# Patient Record
Sex: Female | Born: 1985 | Hispanic: Yes | Marital: Single | Smoking: Never smoker
Health system: Southern US, Community
[De-identification: ages and names within clinical notes are randomized; demographics above are authoritative.]

---

## 2009-04-08 ENCOUNTER — Observation Stay: Payer: Self-pay

## 2009-04-09 ENCOUNTER — Inpatient Hospital Stay: Payer: Self-pay | Admitting: Unknown Physician Specialty

## 2015-02-06 ENCOUNTER — Emergency Department: Payer: Medicaid Other

## 2015-02-06 ENCOUNTER — Encounter: Payer: Self-pay | Admitting: Medical Oncology

## 2015-02-06 ENCOUNTER — Observation Stay
Admission: EM | Admit: 2015-02-06 | Discharge: 2015-02-09 | Disposition: A | Discharge status: Home / Self Care | Payer: Medicaid Other | Attending: Internal Medicine | Admitting: Internal Medicine

## 2015-02-06 DIAGNOSIS — R748 Abnormal levels of other serum enzymes: Secondary | ICD-10-CM | POA: Diagnosis present

## 2015-02-06 DIAGNOSIS — Z23 Encounter for immunization: Secondary | ICD-10-CM | POA: Diagnosis not present

## 2015-02-06 DIAGNOSIS — K838 Other specified diseases of biliary tract: Secondary | ICD-10-CM

## 2015-02-06 DIAGNOSIS — R1013 Epigastric pain: Secondary | ICD-10-CM | POA: Insufficient documentation

## 2015-02-06 DIAGNOSIS — R7989 Other specified abnormal findings of blood chemistry: Secondary | ICD-10-CM | POA: Diagnosis present

## 2015-02-06 DIAGNOSIS — K8020 Calculus of gallbladder without cholecystitis without obstruction: Secondary | ICD-10-CM | POA: Diagnosis not present

## 2015-02-06 DIAGNOSIS — R1010 Upper abdominal pain, unspecified: Secondary | ICD-10-CM

## 2015-02-06 DIAGNOSIS — R945 Abnormal results of liver function studies: Secondary | ICD-10-CM

## 2015-02-06 DIAGNOSIS — R109 Unspecified abdominal pain: Secondary | ICD-10-CM

## 2015-02-06 LAB — POCT PREGNANCY, URINE: PREG TEST UR: NEGATIVE

## 2015-02-06 LAB — URINALYSIS COMPLETE WITH MICROSCOPIC (ARMC ONLY)
BACTERIA UA: NONE SEEN
Bilirubin Urine: NEGATIVE
GLUCOSE, UA: NEGATIVE mg/dL
HGB URINE DIPSTICK: NEGATIVE
Ketones, ur: NEGATIVE mg/dL
LEUKOCYTES UA: NEGATIVE
Nitrite: NEGATIVE
PH: 8 (ref 5.0–8.0)
PROTEIN: NEGATIVE mg/dL
Specific Gravity, Urine: 1.01 (ref 1.005–1.030)

## 2015-02-06 LAB — COMPREHENSIVE METABOLIC PANEL
ALBUMIN: 4.2 g/dL (ref 3.5–5.0)
ALT: 597 U/L — ABNORMAL HIGH (ref 14–54)
AST: 1211 U/L — AB (ref 15–41)
Alkaline Phosphatase: 244 U/L — ABNORMAL HIGH (ref 38–126)
Anion gap: 5 (ref 5–15)
BUN: 11 mg/dL (ref 6–20)
CHLORIDE: 109 mmol/L (ref 101–111)
CO2: 22 mmol/L (ref 22–32)
Calcium: 9 mg/dL (ref 8.9–10.3)
Creatinine, Ser: 0.52 mg/dL (ref 0.44–1.00)
GFR calc Af Amer: >60 mL/min (ref >60)
GLUCOSE: 118 mg/dL — AB (ref 65–99)
POTASSIUM: 3.8 mmol/L (ref 3.5–5.1)
SODIUM: 136 mmol/L (ref 135–145)
Total Bilirubin: 1.6 mg/dL — ABNORMAL HIGH (ref 0.3–1.2)
Total Protein: 7.8 g/dL (ref 6.5–8.1)

## 2015-02-06 LAB — ACETAMINOPHEN LEVEL: Acetaminophen (Tylenol), Serum: 11 ug/mL (ref 10–30)

## 2015-02-06 LAB — CBC
HEMATOCRIT: 40.7 % (ref 35.0–47.0)
Hemoglobin: 13.6 g/dL (ref 12.0–16.0)
MCH: 29.9 pg (ref 26.0–34.0)
MCHC: 33.4 g/dL (ref 32.0–36.0)
MCV: 89.6 fL (ref 80.0–100.0)
Platelets: 196 10*3/uL (ref 150–440)
RBC: 4.54 MIL/uL (ref 3.80–5.20)
RDW: 12.7 % (ref 11.5–14.5)
WBC: 7.9 10*3/uL (ref 3.6–11.0)

## 2015-02-06 LAB — LIPASE, BLOOD
LIPASE: 25 U/L (ref 11–51)
LIPASE: 26 U/L (ref 11–51)

## 2015-02-06 LAB — PROTIME-INR
INR: 1.06
Prothrombin Time: 14 seconds (ref 11.4–15.0)

## 2015-02-06 LAB — TROPONIN I

## 2015-02-06 MED ORDER — IOHEXOL 240 MG/ML SOLN
25.0000 mL | Freq: Once | INTRAMUSCULAR | Status: AC | PRN
Start: 2015-02-06 — End: 2015-02-06
  Administered 2015-02-06: 25 mL via ORAL

## 2015-02-06 MED ORDER — MORPHINE SULFATE (PF) 4 MG/ML IV SOLN
4.0000 mg | Freq: Once | INTRAVENOUS | Status: AC
  Administered 2015-02-06: 4 mg via INTRAVENOUS
  Filled 2015-02-06: qty 1

## 2015-02-06 MED ORDER — SENNOSIDES-DOCUSATE SODIUM 8.6-50 MG PO TABS
1.0000 | ORAL_TABLET | Freq: Every evening | ORAL | Status: DC | PRN
Start: 2015-02-06 — End: 2015-02-09

## 2015-02-06 MED ORDER — ACETAMINOPHEN 325 MG PO TABS: 650.0000 mg | ORAL_TABLET | Freq: Four times a day (QID) | ORAL | Status: DC | PRN

## 2015-02-06 MED ORDER — HYDROCODONE-ACETAMINOPHEN 5-325 MG PO TABS
1.0000 | ORAL_TABLET | ORAL | Status: DC | PRN
  Administered 2015-02-06: 1 via ORAL
  Administered 2015-02-08: 2 via ORAL
  Filled 2015-02-06: qty 2
  Filled 2015-02-06: qty 1

## 2015-02-06 MED ORDER — ONDANSETRON HCL 4 MG/2ML IJ SOLN
4.0000 mg | Freq: Four times a day (QID) | INTRAMUSCULAR | Status: DC | PRN
Start: 2015-02-06 — End: 2015-02-09
  Administered 2015-02-08: 4 mg via INTRAVENOUS
  Filled 2015-02-06: qty 2

## 2015-02-06 MED ORDER — GI COCKTAIL ~~LOC~~
30.0000 mL | Freq: Once | ORAL | Status: AC
  Administered 2015-02-06: 30 mL via ORAL
  Filled 2015-02-06: qty 30

## 2015-02-06 MED ORDER — ACETAMINOPHEN 650 MG RE SUPP: 650.0000 mg | Freq: Four times a day (QID) | RECTAL | Status: DC | PRN

## 2015-02-06 MED ORDER — ONDANSETRON HCL 4 MG/2ML IJ SOLN
4.0000 mg | Freq: Once | INTRAMUSCULAR | Status: AC
  Administered 2015-02-06: 4 mg via INTRAVENOUS
  Filled 2015-02-06: qty 2

## 2015-02-06 MED ORDER — ENOXAPARIN SODIUM 40 MG/0.4ML ~~LOC~~ SOLN
40.0000 mg | SUBCUTANEOUS | Status: DC
  Administered 2015-02-06 – 2015-02-08 (×2): 40 mg via SUBCUTANEOUS
  Filled 2015-02-06 (×2): qty 0.4

## 2015-02-06 MED ORDER — ALUM & MAG HYDROXIDE-SIMETH 200-200-20 MG/5ML PO SUSP: 30.0000 mL | Freq: Four times a day (QID) | ORAL | Status: DC | PRN

## 2015-02-06 MED ORDER — IOHEXOL 300 MG/ML  SOLN
100.0000 mL | Freq: Once | INTRAMUSCULAR | Status: AC | PRN
  Administered 2015-02-06: 100 mL via INTRAVENOUS

## 2015-02-06 MED ORDER — SODIUM CHLORIDE 0.9 % IV SOLN
INTRAVENOUS | Status: DC
  Administered 2015-02-06 – 2015-02-09 (×7): via INTRAVENOUS

## 2015-02-06 MED ORDER — ONDANSETRON HCL 4 MG PO TABS: 4.0000 mg | ORAL_TABLET | Freq: Four times a day (QID) | ORAL | Status: DC | PRN

## 2015-02-06 NOTE — ED Notes (Signed)
Pt reports upper abd pain since yesterday morning with reports of nvd also.

## 2015-02-06 NOTE — ED Notes (Signed)
MD Lord at bedside. 

## 2015-02-06 NOTE — Consult Note (Signed)
GI Inpatient Consult Note Vickie Reese [name] MD  Reason for Consult: abnormal liver tests   Attending Requesting Consult: Dr. Juliene PinaMody  Outpatient Primary Physician: no primary care physician  History of Present Illness: Vickie Reese is a 29 y.o. femalepresenting with nausea vomiting and epigastric pain. Patient states she was in her usual state of health until yesterday morning. She began to develop some epigastric pain. This seemed to radiate both the left and right upper quadrants. He was nauseated and had vomiting. sHe had some diarrhea with this early but not since. Currently her symptoms have improved after hospitalization.  Patient states that she generally does not have any symptoms of nausea vomiting or abdominal pain, no heartburn or dysphagia. She has a bowel movement daily. sHe is seen no black stool blood in the stool or slimy stools. He has never had a endoscopy or colonoscopy. She will occasionally take an aspirin product perhaps once a week. She takes no ibuprofen.She has had no abdominal surgery.  Past Medical History:  History reviewed. No pertinent past medical history.  Problem List: Patient Active Problem List   Diagnosis Date Noted  . Elevated liver enzymes 02/06/2015    Past Surgical History: History reviewed. No pertinent past surgical history.  Allergies: No Known Allergies  Home Medications: No prescriptions prior to admission   Home medication reconciliation was completed with the patient.   Scheduled Inpatient Medications:   . enoxaparin (LOVENOX) injection  40 mg Subcutaneous Q24H    Continuous Inpatient Infusions:   . sodium chloride 125 mL/hr at 02/06/15 1636    PRN Inpatient Medications:  acetaminophen **OR** acetaminophen, alum & mag hydroxide-simeth, HYDROcodone-acetaminophen, ondansetron **OR** ondansetron (ZOFRAN) IV, senna-docusate  Family History: family history is not on file.   GI Family History: negative for colorectal cancer  or liver disease. She does have a brother who had to have his gallbladder removed.  Social History:   reports that she has never smoked. She does not have any smokeless tobacco history on file. The patient denies ETOH, tobacco, or drug use.   ROS  Review of Systems: 10 systems reviewed per admission history and physical agree with same.  Physical Examination: BP 100/71 mmHg  Pulse 59  Temp(Src) 98.1 F (36.7 C) (Oral)  Resp 16  Ht 5' (1.524 m)  Wt 64.411 kg (142 lb)  BMI 27.73 kg/m2  SpO2 100%  LMP 12/23/2014 (Exact Date) Gen: 29 year old female no current distress HEENT:normal cephalic atraumatic eyes are anicteric Neck: supple no JVD no lymphadenopathy no thyromegaly Chest: bilaterally clear to auscultation CV: regular rate and rhythm without rub murmur or gallop Abd: soft mild tenderness to palpation in the upper epigastric region. This seems to also be somewhat toward the right of midline. There is minimal tenderness currently. Is no rebound. Bowel sounds are positive Ext: no clubbing cyanosis or edema Skin:warm and dryOther:  Data: Lab Results  Component Value Date   WBC 7.9 02/06/2015   HGB 13.6 02/06/2015   HCT 40.7 02/06/2015   MCV 89.6 02/06/2015   PLT 196 02/06/2015    Recent Labs Lab 02/06/15 0747  HGB 13.6   Lab Results  Component Value Date   NA 136 02/06/2015   K 3.8 02/06/2015   CL 109 02/06/2015   CO2 22 02/06/2015   BUN 11 02/06/2015   CREATININE 0.52 02/06/2015   Lab Results  Component Value Date   ALT 597* 02/06/2015   AST 1211* 02/06/2015   ALKPHOS 244* 02/06/2015   BILITOT  1.6* 02/06/2015    Recent Labs Lab 02/06/15 0747  INR 1.06   CBC Latest Ref Rng 02/06/2015  WBC 3.6 - 11.0 K/uL 7.9  Hemoglobin 12.0 - 16.0 g/dL 16.1  Hematocrit 09.6 - 47.0 % 40.7  Platelets 150 - 440 K/uL 196    STUDIES: Ct Abdomen Pelvis W Contrast  02/06/2015  CLINICAL DATA:  Patient with upper abdominal pain and vomiting. EXAM: CT ABDOMEN AND  PELVIS WITH CONTRAST TECHNIQUE: Multidetector CT imaging of the abdomen and pelvis was performed using the standard protocol following bolus administration of intravenous contrast. CONTRAST:  100 cc Omnipaque 300 COMPARISON:  Right upper quadrant ultrasound earlier same day. FINDINGS: Lower chest: Heart is normal in size. No pericardial effusion. Minimal atelectasis right lower lobe. No consolidative pulmonary opacity. No pleural effusion. Hepatobiliary: Liver is normal in size and contour. No focal hepatic lesion is identified. Multiple calcified stones are demonstrated within the gallbladder lumen. No intrahepatic or extrahepatic biliary ductal dilatation. High density material is demonstrated throughout the cystic duct and common bile duct which is nondilated. Pancreas: Unremarkable Spleen: Unremarkable Adrenals/Urinary Tract: The adrenal glands are normal. Kidneys enhance symmetrically with contrast. No hydronephrosis. Urinary bladder is unremarkable. Stomach/Bowel: No abnormal bowel wall thickening or evidence for bowel obstruction. The appendix is normal. No free fluid or free intraperitoneal air. Normal morphology of the stomach. Vascular/Lymphatic: Normal caliber abdominal aorta. No retroperitoneal lymphadenopathy. Other: Uterus is unremarkable. Follicle within the left ovary. Normal appearing right ovary. Musculoskeletal: No aggressive or acute appearing osseous lesions. IMPRESSION: There is increased density throughout the cystic duct and proximal common bile duct compatible with choledocholithiasis. The common bile duct is nondilated. There are multiple additional stones and sludge within the gallbladder lumen. Electronically Signed   By: Annia Belt M.D.   On: 02/06/2015 13:31   US Abdomen Limited Ruq  02/06/2015  CLINICAL DATA:  Abnormal liver function studies, 24 hour history of right upper quadrant and generalized abdominal pain associated with nausea and vomiting EXAM: US ABDOMEN LIMITED - RIGHT  UPPER QUADRANT COMPARISON:  None in PACs FINDINGS: Gallbladder: The gallbladder is adequately distended. There are multiple echogenic shadowing stones. Some stones may be mobile. There is no gallbladder wall thickening, pericholecystic fluid, or positive sonographic Murphy's sign. Common bile duct: Diameter: 5.3 mm Liver: The liver exhibits normal echotexture with no focal mass or ductal dilation. IMPRESSION: 1. Multiple gallstones without sonographic evidence of acute cholecystitis. 2. Normal appearance of the liver and common bile duct. Electronically Signed   By: David  [place] M.D.   On: 02/06/2015 11:00   @  Assessment:1.epigastric pain nausea and vomiting with elevated liver enzymes.differential diagnosis to include biliary disorder such as cholelithiasis/acute cholecystitis. However the amount of elevation of the liver enzymes is slightly higher than would be expected for cholecystitis and bilirubin is only minimally elevated.it is of note that the ultrasound showed no evidence of ohms and in the common bile duct or the CT scan did show evidence of likely sludge in the common bile duct which would be consistent clinically with her having passed a stone or sludgy material. currently she is feeling better.of less consideration would be primary issues in the stomach such as gastritis or ulcer.  Recommendations:1. Agree with MRCP as you have scheduled. Would further consider hepatobiliary study to rule out cystic duct obstruction. Daily LFTs and CBC. 2. Follow with you  Thank you for the consult. Please call with questions or concerns.  Vickie Deem, MD  02/06/2015 5:30 PM

## 2015-02-06 NOTE — H&P (Signed)
[facility] - [place] at [facility]   PATIENT NAME: Vickie Reese    MR#:  [id number]  DATE OF BIRTH:  1986-01-03  DATE OF ADMISSION:  02/06/2015  PRIMARY CARE PHYSICIAN: No PCP Per Patient   REQUESTING/REFERRING PHYSICIAN: Dr Shaune Pollack  CHIEF COMPLAINT:  Mid epigastric pain  HISTORY OF PRESENT ILLNESS:  Vickie Reese  is a 29 y.o. female with no past medical history who presents with above complaint. Patient reports since yesterday she had midepigastric abdominal pain radiating to the left back. She also has had nausea and vomiting since moving. She denies fever. She took 2 Tylenol yesterday due to the pain. She denies melena or hematochezia. She denies hematemesis.  In the emergency room she was noted have elevated LFTs. CT scan is concerning for CBD stones. ER physician spoke with GI and surgery who do not believe her elevated LFTs are from gallstones.  PAST MEDICAL HISTORY:  None  PAST SURGICAL HISTORY:  None  SOCIAL HISTORY:   Social History  Substance Use Topics  . Smoking status: Never Smoker   . Smokeless tobacco: Not on file  . Alcohol Use: Not on file    FAMILY HISTORY:  No history of hypertension or CAD  DRUG ALLERGIES:  No Known Allergies   REVIEW OF SYSTEMS:  CONSTITUTIONAL: No fever, fatigue or weakness.  EYES: No blurred or double vision.  EARS, NOSE, AND THROAT: No tinnitus or ear pain.  RESPIRATORY: No cough, shortness of breath, wheezing or hemoptysis.  CARDIOVASCULAR: No chest pain, orthopnea, edema.  GASTROINTESTINAL: positive nausea, vomiting, loose stools and epigastric abdominal pain .  GENITOURINARY: No dysuria, hematuria.  ENDOCRINE: No polyuria, nocturia,  HEMATOLOGY: No anemia, easy bruising or bleeding SKIN: No rash or lesion. MUSCULOSKELETAL: No joint pain or arthritis.   NEUROLOGIC: No tingling, numbness, weakness.  PSYCHIATRY: No anxiety or depression.   MEDICATIONS AT HOME:   None          VITAL SIGNS:  Blood pressure 128/72, pulse 63, temperature 98.6 F (37 C), temperature source Oral, resp. rate 16, height 5' (1.524 m), weight 64.411 kg (142 lb), last menstrual period 12/23/2014, SpO2 100 %.  PHYSICAL EXAMINATION:  GENERAL:  29 y.o.-year-old patient lying in the bed with no acute distress.  EYES: Pupils equal, round, reactive to light and accommodation. No scleral icterus. Extraocular muscles intact.  HEENT: Head atraumatic, normocephalic. Oropharynx and nasopharynx clear.  NECK:  Supple, no jugular venous distention. No thyroid enlargement, no tenderness.  LUNGS: Normal breath sounds bilaterally, no wheezing, rales,rhonchi or crepitation. No use of accessory muscles of respiration.  CARDIOVASCULAR: S1, S2 normal. No murmurs, rubs, or gallops.  ABDOMEN: Midepigastric abdominal pain without rebound or guarding. Bowel sounds present. No organomegaly or mass.  EXTREMITIES: No pedal edema, cyanosis, or clubbing.  NEUROLOGIC: Cranial nerves II through XII are grossly intact. No focal deficits. PSYCHIATRIC: The patient is alert and oriented x 3.  SKIN: No obvious rash, lesion, or ulcer.   LABORATORY PANEL:   CBC  Recent Labs Lab 02/06/15 0747  WBC 7.9  HGB 13.6  HCT 40.7  PLT 196   ------------------------------------------------------------------------------------------------------------------  Chemistries   Recent Labs Lab 02/06/15 0747  NA 136  K 3.8  CL 109  CO2 22  GLUCOSE 118*  BUN 11  CREATININE 0.52  CALCIUM 9.0  AST 1211*  ALT 597*  ALKPHOS 244*  BILITOT 1.6*   ------------------------------------------------------------------------------------------------------------------  Cardiac Enzymes  Recent Labs Lab 02/06/15 0747  TROPONINI <0.03   ------------------------------------------------------------------------------------------------------------------  RADIOLOGY:  Ct Abdomen Pelvis W Contrast  02/06/2015  CLINICAL DATA:  Patient  with upper abdominal pain and vomiting. EXAM: CT ABDOMEN AND PELVIS WITH CONTRAST TECHNIQUE: Multidetector CT imaging of the abdomen and pelvis was performed using the standard protocol following bolus administration of intravenous contrast. CONTRAST:  100 cc Omnipaque 300 COMPARISON:  Right upper quadrant ultrasound earlier same day. FINDINGS: Lower chest: Heart is normal in size. No pericardial effusion. Minimal atelectasis right lower lobe. No consolidative pulmonary opacity. No pleural effusion. Hepatobiliary: Liver is normal in size and contour. No focal hepatic lesion is identified. Multiple calcified stones are demonstrated within the gallbladder lumen. No intrahepatic or extrahepatic biliary ductal dilatation. High density material is demonstrated throughout the cystic duct and common bile duct which is nondilated. Pancreas: Unremarkable Spleen: Unremarkable Adrenals/Urinary Tract: The adrenal glands are normal. Kidneys enhance symmetrically with contrast. No hydronephrosis. Urinary bladder is unremarkable. Stomach/Bowel: No abnormal bowel wall thickening or evidence for bowel obstruction. The appendix is normal. No free fluid or free intraperitoneal air. Normal morphology of the stomach. Vascular/Lymphatic: Normal caliber abdominal aorta. No retroperitoneal lymphadenopathy. Other: Uterus is unremarkable. Follicle within the left ovary. Normal appearing right ovary. Musculoskeletal: No aggressive or acute appearing osseous lesions. IMPRESSION: There is increased density throughout the cystic duct and proximal common bile duct compatible with choledocholithiasis. The common bile duct is nondilated. There are multiple additional stones and sludge within the gallbladder lumen. Electronically Signed   By: Annia Beltrew  Davis M.D.   On: 02/06/2015 13:31   [place] Abdomen Limited Ruq  02/06/2015  CLINICAL DATA:  Abnormal liver function studies, 24 hour history of right upper quadrant and generalized abdominal pain  associated with nausea and vomiting EXAM: US ABDOMEN LIMITED - RIGHT UPPER QUADRANT COMPARISON:  None in PACs FINDINGS: Gallbladder: The gallbladder is adequately distended. There are multiple echogenic shadowing stones. Some stones may be mobile. There is no gallbladder wall thickening, pericholecystic fluid, or positive sonographic Murphy's sign. Common bile duct: Diameter: 5.3 mm Liver: The liver exhibits normal echotexture with no focal mass or ductal dilation. IMPRESSION: 1. Multiple gallstones without sonographic evidence of acute cholecystitis. 2. Normal appearance of the liver and common bile duct. Electronically Signed   By: David  [place]aziland[place] M.D.   On: 02/06/2015 11:00    EKG:   Normal sinus rhythm no ST elevation depression   IMPRESSION AND PLAN:    29 year old female presents with midepigastric pain  and found to have elevated liver function test.    1. Elevated liver function tests: CT scan is suggestive of stones in the CBD. Ultrasound shows no evidence of acute cholecystitis. I will order Tylenol level along with INR and hepatitis panel and HIV panel. GI and surgery have been consulted. I will also order MRCP to clarify this CBD stone.  2. Midepigastric abdominal pain: I will order lipase level to evaluate for pancreatitis and MRCP. She has no signs to suggest peptic ulcer disease.    All the records are reviewed and case discussed with ED provider. Management plans discussed with the patient and she is in agreement.  CODE STATUS: full  TOTAL TIME TAKING CARE OF THIS PATIENT: 50 minutes.    Launa Goedken M.D on 02/06/2015 at 2:26 PM  Between 7am to 6pm - Pager - [phone] After 6pm go to [email or web address] - password [email or web address] [facility]  [facility]  Office  [phone]  CC: Primary care physician; No PCP Per Patient

## 2015-02-06 NOTE — ED Provider Notes (Signed)
[facility] Emergency Department Provider Note   ____________________________________________  Time seen: 8:30 AM I have reviewed the triage vital signs and the triage nursing note.  HISTORY  Chief Complaint Abdominal Pain   Historian Patient through Spanish interpreter  HPI Vickie Reese is a 29 y.o. female with no significant past medical history who is here for evaluation of epigastric burning pain that started last night around 9 PM. No inciting factor. Mild nausea without vomiting. patient diarrhea. No lower abdominal tenderness. Constant but somewhat waxing and waning. No exacerbating or alleviating factors. No fever. No chest pain or shortness of breath, palpitations or trouble breathing.    History reviewed. No pertinent past medical history.  There are no active problems to display for this patient.   History reviewed. No pertinent past surgical history.  No current outpatient prescriptions on file.  Allergies Review of patient's allergies indicates no known allergies.  No family history on file.  Social History Social History  Substance Use Topics  . Smoking status: Never Smoker   . Smokeless tobacco: None  . Alcohol Use: None    Review of Systems  Constitutional: Negative for fever. Eyes: Negative for visual changes. ENT: Negative for sore throat. Cardiovascular: Negative for chest pain. Respiratory: Negative for shortness of breath. Gastrointestinal: Negative for vomiting and diarrhea. Genitourinary: Negative for dysuria. Musculoskeletal: Negative for back pain. Skin: Negative for rash. Neurological: Negative for headache. 10 point Review of Systems otherwise negative ____________________________________________   PHYSICAL EXAM:  VITAL SIGNS: ED Triage Vitals  Enc Vitals Group     BP 02/06/15 0714 128/72 mmHg     Pulse Rate 02/06/15 0714 84     Resp 02/06/15 0714 18     Temp 02/06/15 0714 98.6 F (37 C)    Temp Source 02/06/15 0714 Oral     SpO2 02/06/15 0714 98 %     Weight 02/06/15 0716 142 lb (64.411 kg)     Height 02/06/15 0716 5' (1.524 m)     Head Cir --      Peak Flow --      Pain Score 02/06/15 0716 8     Pain Loc --      Pain Edu? --      Excl. in GC? --      Constitutional: Alert and oriented. Well appearing and in no distress. Eyes: Conjunctivae are normal. PERRL. Normal extraocular movements. ENT   Head: Normocephalic and atraumatic.   Nose: No congestion/rhinnorhea.   Mouth/Throat: Mucous membranes are moist.   Neck: No stridor. Cardiovascular/Chest: Normal rate, regular rhythm.  No murmurs, rubs, or gallops. Respiratory: Normal respiratory effort without tachypnea nor retractions. Breath sounds are clear and equal bilaterally. No wheezes/rales/rhonchi. Gastrointestinal: Soft. No distention, no guarding, no rebound. Mild to moderate epigastric tenderness palpation with mild tenderness in the right upper quadrant and left upper quadrant. No lower abdominal tenderness to palpation.  Genitourinary/rectal:Deferred Musculoskeletal: Nontender with normal range of motion in all extremities. No joint effusions.  No lower extremity tenderness.  No edema. Neurologic:  Normal speech and language. No gross or focal neurologic deficits are appreciated. Skin:  Skin is warm, dry and intact. No rash noted. Psychiatric: Mood and affect are normal. Speech and behavior are normal. Patient exhibits appropriate insight and judgment.  ____________________________________________   EKG I, Governor Rooks, MD, the attending physician have personally viewed and interpreted all ECGs.  60 bpm. Normal sinus rhythm. Narrow QRS. Normal axis. Nonspecific ST/T-wave ____________________________________________  LABS (pertinent positives/negatives)  Pregnancy test negative Lipase 25 Comprehensive metabolic panel significant for AST 1211, a LT 597, alkaline phosphatase 244, bilirubin total  1.6 White blood counts and 0.9, hemoglobin 30.6 and platelet count 196 Urinalysis without significant abnormalities  ____________________________________________  RADIOLOGY All Xrays were viewed by me. Imaging interpreted by Radiologist.  Ultrasound right upper quadrant:  IMPRESSION: 1. Multiple gallstones without sonographic evidence of acute cholecystitis. 2. Normal appearance of the liver and common bile duct.  CT abdomen and pelvis with contrast:   IMPRESSION: There is increased density throughout the cystic duct and proximal common bile duct compatible with choledocholithiasis. The common bile duct is nondilated. There are multiple additional stones and sludge within the gallbladder lumen. __________________________________________  PROCEDURES  Procedure(s) performed: None  Critical Care performed: None  ____________________________________________   ED COURSE / ASSESSMENT AND PLAN  CONSULTATIONS: Phone consultation with Dr. Egbert GaribaldiBird, general surgery, will come and evaluate the patient. Phone consultation with gastroenterology, Dr. Marva PandaSkulskie, recommended CT scan of the abdomen and pelvis, and medical evaluation for abnormal LFTs.  Pertinent labs & imaging results that were available during my care of the patient were reviewed by me and considered in my medical decision making (see chart for details).  My suspicion is that her symptoms clinically are due to acid reflux/indigestion/gastritis. Patient given GI cocktail.  On monitor evaluation, LFTs came back significantly elevated, and although less consistent with obstructive biliary pattern, I did obtain a culture Of the Right Upper Quadrant Initially and This Does Show Multiple Gallstones, However without Evidence of Cholecystitis or Bile Duct Dilatation.  I discussed the case with general surgeon Dr. Egbert GaribaldiBird, who will come to evaluate the patient, but felt the LFT pattern was less likely consistent with biliary obstruction  pattern.  I discussed the case with the gastroenterologist, Dr. Marva PandaSkulskie, who recommended additional imaging and recommended CT scan abdomen and pelvis with contrast, and recommended medical evaluation for abnormal LFTs, however he felt like he was still possible this pattern could be due to biliary process.  He did recommend that if not gallbladder related, would likely need observation for expedited medical evaluation for severely elevated LFTs.  ----------------------------------------- 1:45 PM on 02/06/2015 -----------------------------------------  I reviewed CT abdomen results, read by radiologist consistent with choledocholithiasis. I discussed the case again with Dr. Egbert GaribaldiBird, who will see the patient in consultation, but recommends medical admission for evaluation of other source for her elevated/abnormal LFTs.  Discussed with Dr. Juliene PinaMody, hospitalist for admission.   Patient / Family / Caregiver informed of clinical course, medical decision-making process, and agree with plan.   ___________________________________________   FINAL CLINICAL IMPRESSION(S) / ED DIAGNOSES   Final diagnoses:  Gastritis  Abnormal LFTs  Upper abdominal pain       Governor Rooksebecca Macie Baum, MD 02/06/15 1349

## 2015-02-07 ENCOUNTER — Observation Stay: Payer: Medicaid Other

## 2015-02-07 LAB — COMPREHENSIVE METABOLIC PANEL
ALBUMIN: 3.6 g/dL (ref 3.5–5.0)
ALT: 544 U/L — ABNORMAL HIGH (ref 14–54)
ANION GAP: 6 (ref 5–15)
AST: 499 U/L — ABNORMAL HIGH (ref 15–41)
Alkaline Phosphatase: 282 U/L — ABNORMAL HIGH (ref 38–126)
BUN: 11 mg/dL (ref 6–20)
CO2: 19 mmol/L — AB (ref 22–32)
Calcium: 8.1 mg/dL — ABNORMAL LOW (ref 8.9–10.3)
Chloride: 112 mmol/L — ABNORMAL HIGH (ref 101–111)
Creatinine, Ser: 0.35 mg/dL — ABNORMAL LOW (ref 0.44–1.00)
GFR calc non Af Amer: >60 mL/min (ref >60)
GLUCOSE: 63 mg/dL — AB (ref 65–99)
POTASSIUM: 3.5 mmol/L (ref 3.5–5.1)
SODIUM: 137 mmol/L (ref 135–145)
TOTAL PROTEIN: 6.7 g/dL (ref 6.5–8.1)
Total Bilirubin: 4.3 mg/dL — ABNORMAL HIGH (ref 0.3–1.2)

## 2015-02-07 LAB — PROTIME-INR
INR: 1.14
Prothrombin Time: 14.8 seconds (ref 11.4–15.0)

## 2015-02-07 LAB — CBC
HEMATOCRIT: 38.3 % (ref 35.0–47.0)
Hemoglobin: 12.7 g/dL (ref 12.0–16.0)
MCH: 29.6 pg (ref 26.0–34.0)
MCHC: 33.1 g/dL (ref 32.0–36.0)
MCV: 89.6 fL (ref 80.0–100.0)
PLATELETS: 168 10*3/uL (ref 150–440)
RBC: 4.28 MIL/uL (ref 3.80–5.20)
RDW: 12.6 % (ref 11.5–14.5)
WBC: 8.2 10*3/uL (ref 3.6–11.0)

## 2015-02-07 LAB — HEPATITIS PANEL, ACUTE
HCV Ab: <0.1 s/co ratio (ref 0.0–0.9)
HEP A IGM: NEGATIVE
HEP B C IGM: NEGATIVE
Hepatitis B Surface Ag: NEGATIVE

## 2015-02-07 MED ORDER — GADOBENATE DIMEGLUMINE 529 MG/ML IV SOLN
15.0000 mL | Freq: Once | INTRAVENOUS | Status: AC | PRN
  Administered 2015-02-07: 13 mL via INTRAVENOUS

## 2015-02-07 MED ORDER — BOOST / RESOURCE BREEZE PO LIQD: 1.0000 | Freq: Three times a day (TID) | ORAL | Status: DC

## 2015-02-07 NOTE — Progress Notes (Signed)
[facility] [place] [facility]   PATIENT NAME: Vickie Reese    MR#:  [id number]  DATE OF BIRTH:  Nov 15, 1985  SUBJECTIVE:  CHIEF COMPLAINT:   Chief Complaint  Patient presents with  . Abdominal Pain   No pain this am. LFT's trending down.  REVIEW OF SYSTEMS:   Review of Systems  Constitutional: Negative for fever.  Respiratory: Negative for shortness of breath.   Cardiovascular: Negative for chest pain and palpitations.  Gastrointestinal: Negative for nausea, vomiting and abdominal pain.  Genitourinary: Negative for dysuria.    DRUG ALLERGIES:  No Known Allergies  VITALS:  Blood pressure 109/58, pulse 73, temperature 98 F (36.7 C), temperature source Oral, resp. rate 18, height 5' (1.524 m), weight 64.411 kg (142 lb), last menstrual period 12/23/2014, SpO2 100 %.  PHYSICAL EXAMINATION:  GENERAL:  29 y.o.-year-old patient lying in the bed with no acute distress.  LUNGS: Normal breath sounds bilaterally, no wheezing, rales,rhonchi or crepitation. No use of accessory muscles of respiration.  CARDIOVASCULAR: S1, S2 normal. No murmurs, rubs, or gallops.  ABDOMEN: Soft, nontender, nondistended. Bowel sounds present. No organomegaly or mass.  EXTREMITIES: No pedal edema, cyanosis, or clubbing.  NEUROLOGIC: Cranial nerves II through XII are intact. Muscle strength 5/5 in all extremities. Sensation intact. Gait not checked.  PSYCHIATRIC: The patient is alert and oriented x 3.  SKIN: No obvious rash, lesion, or ulcer.    LABORATORY PANEL:   CBC  Recent Labs Lab 02/07/15 0531  WBC 8.2  HGB 12.7  HCT 38.3  PLT 168   ------------------------------------------------------------------------------------------------------------------  Chemistries   Recent Labs Lab 02/07/15 0531  NA 137  K 3.5  CL 112*  CO2 19*  GLUCOSE 63*  BUN 11  CREATININE 0.35*  CALCIUM 8.1*  AST 499*  ALT 544*  ALKPHOS 282*  BILITOT 4.3*    ------------------------------------------------------------------------------------------------------------------  Cardiac Enzymes  Recent Labs Lab 02/06/15 0747  TROPONINI <0.03   ------------------------------------------------------------------------------------------------------------------  RADIOLOGY:  Ct Abdomen Pelvis W Contrast  02/06/2015  CLINICAL DATA:  Patient with upper abdominal pain and vomiting. EXAM: CT ABDOMEN AND PELVIS WITH CONTRAST TECHNIQUE: Multidetector CT imaging of the abdomen and pelvis was performed using the standard protocol following bolus administration of intravenous contrast. CONTRAST:  100 cc Omnipaque 300 COMPARISON:  Right upper quadrant ultrasound earlier same day. FINDINGS: Lower chest: Heart is normal in size. No pericardial effusion. Minimal atelectasis right lower lobe. No consolidative pulmonary opacity. No pleural effusion. Hepatobiliary: Liver is normal in size and contour. No focal hepatic lesion is identified. Multiple calcified stones are demonstrated within the gallbladder lumen. No intrahepatic or extrahepatic biliary ductal dilatation. High density material is demonstrated throughout the cystic duct and common bile duct which is nondilated. Pancreas: Unremarkable Spleen: Unremarkable Adrenals/Urinary Tract: The adrenal glands are normal. Kidneys enhance symmetrically with contrast. No hydronephrosis. Urinary bladder is unremarkable. Stomach/Bowel: No abnormal bowel wall thickening or evidence for bowel obstruction. The appendix is normal. No free fluid or free intraperitoneal air. Normal morphology of the stomach. Vascular/Lymphatic: Normal caliber abdominal aorta. No retroperitoneal lymphadenopathy. Other: Uterus is unremarkable. Follicle within the left ovary. Normal appearing right ovary. Musculoskeletal: No aggressive or acute appearing osseous lesions. IMPRESSION: There is increased density throughout the cystic duct and proximal common bile  duct compatible with choledocholithiasis. The common bile duct is nondilated. There are multiple additional stones and sludge within the gallbladder lumen. Electronically Signed   By: Annia Belt M.D.   On: 02/06/2015 13:31  Mr Abd W/wo Cm/mrcp  02/07/2015  CLINICAL DATA:  Epigastric pain. EXAM: MRI ABDOMEN WITHOUT AND WITH CONTRAST (INCLUDING MRCP) TECHNIQUE: Multiplanar multisequence MR imaging of the abdomen was performed both before and after the administration of intravenous contrast. Heavily T2-weighted images of the biliary and pancreatic ducts were obtained, and three-dimensional MRCP images were rendered by post processing. CONTRAST:  13mL MULTIHANCE GADOBENATE DIMEGLUMINE 529 MG/ML IV SOLN COMPARISON:  02/06/2015 FINDINGS: Lower chest:  No pleural fluid.  No pericardial effusion. Hepatobiliary: No suspicious liver abnormality. Multiple small stones and sludge the gallbladder wall appears mildly thickened measuring 4 mm, image 16 of series 16. No biliary dilatation. Pancreas: Normal appearance of the pancreas. Spleen: The spleen is unremarkable. Adrenals/Urinary Tract: Normal appearance of the adrenal glands. The kidneys both appear within normal limits. No obstructive uropathy. Stomach/Bowel: The stomach is within normal limits. No abnormal dilatation of the upper abdominal bowel loops. Vascular/Lymphatic: The abdominal aorta appears normal. No enlarged upper abdominal lymph nodes. Other: No free fluid or fluid collections. Musculoskeletal: Normal signal from within the bone marrow. IMPRESSION: 1. Gallstones. 2. Mild gallbladder wall thickening. 3. No evidence for biliary dilatation or choledocholithiasis. Electronically Signed   By: Signa Kellaylor  Stroud M.D.   On: 02/07/2015 14:24   [place] Abdomen Limited Ruq  02/06/2015  CLINICAL DATA:  Abnormal liver function studies, 24 hour history of right upper quadrant and generalized abdominal pain associated with nausea and vomiting EXAM: US ABDOMEN LIMITED -  RIGHT UPPER QUADRANT COMPARISON:  None in PACs FINDINGS: Gallbladder: The gallbladder is adequately distended. There are multiple echogenic shadowing stones. Some stones may be mobile. There is no gallbladder wall thickening, pericholecystic fluid, or positive sonographic Murphy's sign. Common bile duct: Diameter: 5.3 mm Liver: The liver exhibits normal echotexture with no focal mass or ductal dilation. IMPRESSION: 1. Multiple gallstones without sonographic evidence of acute cholecystitis. 2. Normal appearance of the liver and common bile duct. Electronically Signed   By: David  Swazil[place]Jordan M.D.   On: 02/06/2015 11:00    EKG:   Orders placed or performed during the hospital encounter of 02/06/15  . ED EKG  . ED EKG  . EKG 12-Lead  . EKG 12-Lead    ASSESSMENT AND PLAN:   1. Abdominal pain: Improved - MRCP shows small stones in GB no obstruction - LFTs but bilirubin is still elevated - We will obtain gallbladder function HIDA - Repeat LFTs in a.m. - She has likely passed a stone and will benefit from outpatient surgical follow-up    All the records are reviewed and case discussed with Care Management/Social Workerr. Management plans discussed with the patient, family and they are in agreement.  CODE STATUS: Full  TOTAL TIME TAKING CARE OF THIS PATIENT: 25 minutes.  Greater than 50% of time spent in care coordination and counseling. POSSIBLE D/C IN 1 DAYS, DEPENDING ON CLINICAL CONDITION.   Elby ShowersWALSH, Akosua Constantine M.D on 02/07/2015 at 2:55 PM  Between 7am to 6pm - Pager - [phone]  After 6pm go to [email or web address] - password [email or web address] [facility]  [facility]  Office  [phone]  CC: Primary care physician; No PCP Per Patient

## 2015-02-07 NOTE — Consult Note (Signed)
Subjective: Patient seen for abnormal lfts.  Feeling much better today.  No nausea or emesis, denies abdominal pain.   Objective: Vital signs in last 24 hours: Temp:  [98 F (36.7 C)-98.6 F (37 C)] 98 F (36.7 C) (12/21 1419) Pulse Rate:  [57-73] 73 (12/21 1419) Resp:  [17-18] 18 (12/21 1419) BP: (104-109)/(57-58) 109/58 mmHg (12/21 1419) SpO2:  [100 %] 100 % (12/21 1419) Blood pressure 109/58, pulse 73, temperature 98 F (36.7 C), temperature source Oral, resp. rate 18, height 5' (1.524 m), weight 64.411 kg (142 lb), last menstrual period 12/23/2014, SpO2 100 %.   Intake/Output from previous day: 12/20 0701 - 12/21 0700 In: 1160 [I.V.:1160] Out: 450 [Urine:450]  Intake/Output this shift: Total I/O In: 129 [I.V.:129] Out: 0    General appearance:  25 female no distress. Resp:  cta Cardio:  rrr GI:  Soft, nontender, nondistended,  Bowel sounds positive. Extremities:  No cce   Lab Results: Results for orders placed or performed during the hospital encounter of 02/06/15 (from the past 24 hour(s))  Comprehensive metabolic panel     Status: Abnormal   Collection Time: 02/07/15  5:31 AM  Result Value Ref Range   Sodium 137 135 - 145 mmol/L   Potassium 3.5 3.5 - 5.1 mmol/L   Chloride 112 (H) 101 - 111 mmol/L   CO2 19 (L) 22 - 32 mmol/L   Glucose, Bld 63 (L) 65 - 99 mg/dL   BUN 11 6 - 20 mg/dL   Creatinine, Ser 1.61 (L) 0.44 - 1.00 mg/dL   Calcium 8.1 (L) 8.9 - 10.3 mg/dL   Total Protein 6.7 6.5 - 8.1 g/dL   Albumin 3.6 3.5 - 5.0 g/dL   AST 096 (H) 15 - 41 U/L   ALT 544 (H) 14 - 54 U/L   Alkaline Phosphatase 282 (H) 38 - 126 U/L   Total Bilirubin 4.3 (H) 0.3 - 1.2 mg/dL   GFR calc non Af Amer >60 >60 mL/min   GFR calc Af Amer >60 >60 mL/min   Anion gap 6 5 - 15  Protime-INR     Status: None   Collection Time: 02/07/15  5:31 AM  Result Value Ref Range   Prothrombin Time 14.8 11.4 - 15.0 seconds   INR 1.14   CBC     Status: None   Collection Time: 02/07/15  5:31  AM  Result Value Ref Range   WBC 8.2 3.6 - 11.0 K/uL   RBC 4.28 3.80 - 5.20 MIL/uL   Hemoglobin 12.7 12.0 - 16.0 g/dL   HCT 04.5 40.9 - 81.1 %   MCV 89.6 80.0 - 100.0 fL   MCH 29.6 26.0 - 34.0 pg   MCHC 33.1 32.0 - 36.0 g/dL   RDW 91.4 78.2 - 95.6 %   Platelets 168 150 - 440 K/uL      Recent Labs  02/06/15 0747 02/07/15 0531  WBC 7.9 8.2  HGB 13.6 12.7  HCT 40.7 38.3  PLT 196 168   BMET  Recent Labs  02/06/15 0747 02/07/15 0531  NA 136 137  K 3.8 3.5  CL 109 112*  CO2 22 19*  GLUCOSE 118* 63*  BUN 11 11  CREATININE 0.52 0.35*  CALCIUM 9.0 8.1*   LFT  Recent Labs  02/07/15 0531  PROT 6.7  ALBUMIN 3.6  AST 499*  ALT 544*  ALKPHOS 282*  BILITOT 4.3*   PT/INR  Recent Labs  02/06/15 0747 02/07/15 0531  LABPROT 14.0 14.8  INR  1.06 1.14   Hepatitis Panel  Recent Labs  02/06/15 0747  HEPBSAG Negative  HCVAB <0.1  HEPAIGM Negative  HEPBIGM Negative   C-Diff No results for input(s): CDIFFTOX in the last 72 hours. No results for input(s): CDIFFPCR in the last 72 hours.   Studies/Results: Ct Abdomen Pelvis W Contrast  02/06/2015  CLINICAL DATA:  Patient with upper abdominal pain and vomiting. EXAM: CT ABDOMEN AND PELVIS WITH CONTRAST TECHNIQUE: Multidetector CT imaging of the abdomen and pelvis was performed using the standard protocol following bolus administration of intravenous contrast. CONTRAST:  100 cc Omnipaque 300 COMPARISON:  Right upper quadrant ultrasound earlier same day. FINDINGS: Lower chest: Heart is normal in size. No pericardial effusion. Minimal atelectasis right lower lobe. No consolidative pulmonary opacity. No pleural effusion. Hepatobiliary: Liver is normal in size and contour. No focal hepatic lesion is identified. Multiple calcified stones are demonstrated within the gallbladder lumen. No intrahepatic or extrahepatic biliary ductal dilatation. High density material is demonstrated throughout the cystic duct and common bile duct  which is nondilated. Pancreas: Unremarkable Spleen: Unremarkable Adrenals/Urinary Tract: The adrenal glands are normal. Kidneys enhance symmetrically with contrast. No hydronephrosis. Urinary bladder is unremarkable. Stomach/Bowel: No abnormal bowel wall thickening or evidence for bowel obstruction. The appendix is normal. No free fluid or free intraperitoneal air. Normal morphology of the stomach. Vascular/Lymphatic: Normal caliber abdominal aorta. No retroperitoneal lymphadenopathy. Other: Uterus is unremarkable. Follicle within the left ovary. Normal appearing right ovary. Musculoskeletal: No aggressive or acute appearing osseous lesions. IMPRESSION: There is increased density throughout the cystic duct and proximal common bile duct compatible with choledocholithiasis. The common bile duct is nondilated. There are multiple additional stones and sludge within the gallbladder lumen. Electronically Signed   By: Annia Belt M.D.   On: 02/06/2015 13:31   Mr Roe Coombs W/wo Cm/mrcp  02/07/2015  CLINICAL DATA:  Epigastric pain. EXAM: MRI ABDOMEN WITHOUT AND WITH CONTRAST (INCLUDING MRCP) TECHNIQUE: Multiplanar multisequence MR imaging of the abdomen was performed both before and after the administration of intravenous contrast. Heavily T2-weighted images of the biliary and pancreatic ducts were obtained, and three-dimensional MRCP images were rendered by post processing. CONTRAST:  13mL MULTIHANCE GADOBENATE DIMEGLUMINE 529 MG/ML IV SOLN COMPARISON:  02/06/2015 FINDINGS: Lower chest:  No pleural fluid.  No pericardial effusion. Hepatobiliary: No suspicious liver abnormality. Multiple small stones and sludge the gallbladder wall appears mildly thickened measuring 4 mm, image 16 of series 16. No biliary dilatation. Pancreas: Normal appearance of the pancreas. Spleen: The spleen is unremarkable. Adrenals/Urinary Tract: Normal appearance of the adrenal glands. The kidneys both appear within normal limits. No obstructive  uropathy. Stomach/Bowel: The stomach is within normal limits. No abnormal dilatation of the upper abdominal bowel loops. Vascular/Lymphatic: The abdominal aorta appears normal. No enlarged upper abdominal lymph nodes. Other: No free fluid or fluid collections. Musculoskeletal: Normal signal from within the bone marrow. IMPRESSION: 1. Gallstones. 2. Mild gallbladder wall thickening. 3. No evidence for biliary dilatation or choledocholithiasis. Electronically Signed   By: Signa Kell M.D.   On: 02/07/2015 14:24   US Abdomen Limited Ruq  02/06/2015  CLINICAL DATA:  Abnormal liver function studies, 24 hour history of right upper quadrant and generalized abdominal pain associated with nausea and vomiting EXAM: US ABDOMEN LIMITED - RIGHT UPPER QUADRANT COMPARISON:  None in PACs FINDINGS: Gallbladder: The gallbladder is adequately distended. There are multiple echogenic shadowing stones. Some stones may be mobile. There is no gallbladder wall thickening, pericholecystic fluid, or positive sonographic Murphy's sign. Common  bile duct: Diameter: 5.3 mm Liver: The liver exhibits normal echotexture with no focal mass or ductal dilation. IMPRESSION: 1. Multiple gallstones without sonographic evidence of acute cholecystitis. 2. Normal appearance of the liver and common bile duct. Electronically Signed   By: David  [place]azil[place]Jordan M.D.   On: 02/06/2015 11:00    Scheduled Inpatient Medications:   . enoxaparin (LOVENOX) injection  40 mg Subcutaneous Q24H    Continuous Inpatient Infusions:   . sodium chloride 125 mL/hr at 02/07/15 0009    PRN Inpatient Medications:  acetaminophen **OR** acetaminophen, alum & mag hydroxide-simeth, HYDROcodone-acetaminophen, ondansetron **OR** ondansetron (ZOFRAN) IV, senna-docusate  Miscellaneous:   Assessment:  1) abnormal lfts of obscure etiology.  CT showing gallstones in gb and debris in cbd, us showing dilated cbd.  MRCP showing cholelithiasis without choledocholithiasis, however  bili increased to 4.3.  With some improvement of the hepatocellular enzymes.   Plan:  1) recommend hepatobiliary study without cck.  Case discussed with Radiology, will allow clears today, plan for hida tomorrow in am to rule out obstruction of cbd or cystic duct.   Discussed with Dr Clent RidgesWalsh.   Christena DeemMartin U Orvie Caradine MD 02/07/2015, 4:20 PM

## 2015-02-07 NOTE — Plan of Care (Signed)
Problem: Nutrition: Goal: Adequate nutrition will be maintained Outcome: Not Progressing Pt is NPO   

## 2015-02-08 ENCOUNTER — Observation Stay: Payer: Medicaid Other

## 2015-02-08 ENCOUNTER — Encounter: Payer: Self-pay | Admitting: Radiology

## 2015-02-08 LAB — COMPREHENSIVE METABOLIC PANEL
ALBUMIN: 3.3 g/dL — AB (ref 3.5–5.0)
ALK PHOS: 258 U/L — AB (ref 38–126)
ALT: 343 U/L — AB (ref 14–54)
AST: 227 U/L — AB (ref 15–41)
Anion gap: 4 — ABNORMAL LOW (ref 5–15)
BILIRUBIN TOTAL: 2.8 mg/dL — AB (ref 0.3–1.2)
BUN: 6 mg/dL (ref 6–20)
CALCIUM: 7.8 mg/dL — AB (ref 8.9–10.3)
CO2: 19 mmol/L — ABNORMAL LOW (ref 22–32)
Chloride: 108 mmol/L (ref 101–111)
Creatinine, Ser: 0.4 mg/dL — ABNORMAL LOW (ref 0.44–1.00)
GFR calc Af Amer: >60 mL/min (ref >60)
GFR calc non Af Amer: >60 mL/min (ref >60)
GLUCOSE: 85 mg/dL (ref 65–99)
Potassium: 3.4 mmol/L — ABNORMAL LOW (ref 3.5–5.1)
Sodium: 131 mmol/L — ABNORMAL LOW (ref 135–145)
TOTAL PROTEIN: 6.2 g/dL — AB (ref 6.5–8.1)

## 2015-02-08 LAB — HEPATIC FUNCTION PANEL
ALK PHOS: 285 U/L — AB (ref 38–126)
ALT: 369 U/L — ABNORMAL HIGH (ref 14–54)
AST: 211 U/L — AB (ref 15–41)
Albumin: 3.8 g/dL (ref 3.5–5.0)
BILIRUBIN DIRECT: 1.8 mg/dL — AB (ref 0.1–0.5)
BILIRUBIN TOTAL: 3.4 mg/dL — AB (ref 0.3–1.2)
Indirect Bilirubin: 1.6 mg/dL — ABNORMAL HIGH (ref 0.3–0.9)
Total Protein: 7.4 g/dL (ref 6.5–8.1)

## 2015-02-08 LAB — CBC
HEMATOCRIT: 36.6 % (ref 35.0–47.0)
HEMOGLOBIN: 12.1 g/dL (ref 12.0–16.0)
MCH: 29.2 pg (ref 26.0–34.0)
MCHC: 33 g/dL (ref 32.0–36.0)
MCV: 88.4 fL (ref 80.0–100.0)
Platelets: 175 10*3/uL (ref 150–440)
RBC: 4.14 MIL/uL (ref 3.80–5.20)
RDW: 12.5 % (ref 11.5–14.5)
WBC: 7.8 10*3/uL (ref 3.6–11.0)

## 2015-02-08 LAB — BILIRUBIN, DIRECT: Bilirubin, Direct: 1.3 mg/dL — ABNORMAL HIGH (ref 0.1–0.5)

## 2015-02-08 MED ORDER — POTASSIUM CHLORIDE CRYS ER 20 MEQ PO TBCR
40.0000 meq | EXTENDED_RELEASE_TABLET | Freq: Once | ORAL | Status: AC
  Administered 2015-02-08: 40 meq via ORAL
  Filled 2015-02-08: qty 2

## 2015-02-08 MED ORDER — TECHNETIUM TC 99M MEBROFENIN IV KIT
7.3990 | PACK | Freq: Once | INTRAVENOUS | Status: AC | PRN
  Administered 2015-02-08: 7.399 via INTRAVENOUS

## 2015-02-08 NOTE — Discharge Summary (Signed)
[facility] [place] at [facility]  DISCHARGE SUMMARY   PATIENT NAME: Vickie Reese    MR#:  [id number]  DATE OF BIRTH:  01/16/1986  DATE OF ADMISSION:  02/06/2015 ADMITTING PHYSICIAN: Adrian Saran, MD  DATE OF DISCHARGE: 02/06/2015  PRIMARY CARE PHYSICIAN: No PCP Per Patient   Note that this patient is Spanish speaking. All conversations were had with the assistance of an interpreter.  ADMISSION DIAGNOSIS:  Gallstones [K80.20] Upper abdominal pain [R10.10] Abnormal LFTs [R79.89]  DISCHARGE DIAGNOSIS:  Active Problems:   Elevated liver enzymes   SECONDARY DIAGNOSIS:  History reviewed. No pertinent past medical history.  HOSPITAL COURSE:   1. Abdominal pain: Resolved. Likely due to passage of gallstone. MRCP shows small stones in GB no obstruction. HIDA scan shows patent cystic and hepatic duct. LFTs trending downwards. At this point she is asymptomatic and labs improving. She will be discharged with a one-week follow-up with gastroenterology to reassess LFTs. No new medications. She has been instructed to maintain a simple low-fat diet.  DISCHARGE CONDITIONS:   Stable  CONSULTS OBTAINED:  Treatment Team:  Christena Deem, MD  DRUG ALLERGIES:  No Known Allergies  DISCHARGE MEDICATIONS:  There are no discharge medications for this patient.  DISCHARGE INSTRUCTIONS:    Low-fat diet. Discharge to home. Stable condition. No home health needs.  If you experience worsening of your admission symptoms, develop shortness of breath, life threatening emergency, suicidal or homicidal thoughts you must seek medical attention immediately by calling 911 or calling your MD immediately  if symptoms less severe.  You Must read complete instructions/literature along with all the possible adverse reactions/side effects for all the Medicines you take and that have been prescribed to you. Take any new Medicines after you have completely understood and  accept all the possible adverse reactions/side effects.   Please note  You were cared for by a hospitalist during your hospital stay. If you have any questions about your discharge medications or the care you received while you were in the hospital after you are discharged, you can call the unit and asked to speak with the hospitalist on call if the hospitalist that took care of you is not available. Once you are discharged, your primary care physician will handle any further medical issues. Please note that NO REFILLS for any discharge medications will be authorized once you are discharged, as it is imperative that you return to your primary care physician (or establish a relationship with a primary care physician if you do not have one) for your aftercare needs so that they can reassess your need for medications and monitor your lab values.    Today   CHIEF COMPLAINT:   Chief Complaint  Patient presents with  . Abdominal Pain    HISTORY OF PRESENT ILLNESS:  Vickie Reese is a 29 y.o. female with no past medical history who presents with above complaint. Patient reports since yesterday she had midepigastric abdominal pain radiating to the left back. She also has had nausea and vomiting since moving. She denies fever. She took 2 Tylenol yesterday due to the pain. She denies melena or hematochezia. She denies hematemesis.  In the emergency room she was noted have elevated LFTs. CT scan is concerning for CBD stones. ER physician spoke with GI and surgery who do not believe her elevated LFTs are from gallstones.   VITAL SIGNS:  Blood pressure 115/75, pulse 84, temperature 98.6 F (37 C), temperature source Oral, resp. rate  18, height 5' (1.524 m), weight 64.411 kg (142 lb), last menstrual period 12/23/2014, SpO2 100 %.  I/O:   Intake/Output Summary (Last 24 hours) at 02/08/15 1353 Last data filed at 02/08/15 1200  Gross per 24 hour  Intake 4039.24 ml  Output    700 ml  Net  3339.24 ml    PHYSICAL EXAMINATION:  GENERAL:  29 y.o.-year-old patient lying in the bed with no acute distress.  LUNGS: Normal breath sounds bilaterally, no wheezing, rales,rhonchi or crepitation. No use of accessory muscles of respiration.  CARDIOVASCULAR: S1, S2 normal. No murmurs, rubs, or gallops.  ABDOMEN: Soft, non-tender, non-distended. Bowel sounds present. No organomegaly or mass.  EXTREMITIES: No pedal edema, cyanosis, or clubbing.  NEUROLOGIC: Cranial nerves II through XII are intact. Muscle strength 5/5 in all extremities. Sensation intact. Gait not checked.  PSYCHIATRIC: The patient is alert and oriented x 3.  SKIN: No obvious rash, lesion, or ulcer.   DATA REVIEW:   CBC  Recent Labs Lab 02/08/15 0545  WBC 7.8  HGB 12.1  HCT 36.6  PLT 175    Chemistries   Recent Labs Lab 02/08/15 0545  NA 131*  K 3.4*  CL 108  CO2 19*  GLUCOSE 85  BUN 6  CREATININE 0.40*  CALCIUM 7.8*  AST 227*  ALT 343*  ALKPHOS 258*  BILITOT 2.8*    Cardiac Enzymes  Recent Labs Lab 02/06/15 0747  TROPONINI <0.03    Microbiology Results  No results found for this or any previous visit.  RADIOLOGY:  Nm Hepatobiliary Liver Func  02/08/2015  CLINICAL DATA:  Right upper quadrant pain, cholelithiasis, elevated bili Rubin EXAM: NUCLEAR MEDICINE HEPATOBILIARY IMAGING TECHNIQUE: Sequential images of the abdomen were obtained out to 60 minutes following intravenous administration of radiopharmaceutical. RADIOPHARMACEUTICALS:  7.4 mCi Tc-80m  Choletec IV COMPARISON:  02/07/2015, 02/06/2015 FINDINGS: There is normal hepatic uptake of the radiotracer and excretion into the biliary tract. Gallbladder is visualized at approximately 20 minutes and continues to accumulate radiotracer. Therefore the cystic duct and common bile duct are patent. Contrast progresses throughout the small bowel. IMPRESSION: Patent cystic duct and common bile duct. Electronically Signed   By: M.  Shick M.D.   On:  02/08/2015 11:41   Mr Abd W/wo Cm/mrcp  02/07/2015  CLINICAL DATA:  Epigastric pain. EXAM: MRI ABDOMEN WITHOUT AND WITH CONTRAST (INCLUDING MRCP) TECHNIQUE: Multiplanar multisequence MR imaging of the abdomen was performed both before and after the administration of intravenous contrast. Heavily T2-weighted images of the biliary and pancreatic ducts were obtained, and three-dimensional MRCP images were rendered by post processing. CONTRAST:  29m[facility] HospiEverJ8Ale110Darc>[id number] MontoursviEverJ8Ale63Darc>[id number]piEverJ8Ale74Darc>[id number] EverJ8Ale37Darc>[id number]piEverJ8Ale42Darc>[id number] HospiEverJ8Ale51Darc>[id number] Hospital [facility] ClinEverJ8AleDar192867m7[facility] EverJ8Ale3Darc>[id number] SomerEverJ8Ale28Darc>[id number] CenEverJ8Ale66Darc>[id number] BaldEverJ8Ale57Darc>[id number] CenEverJ8Ale46Darc>[id number] CenEverJ8Ale14Darc>[id number] WindEverJ8Ale67Darc>[id number] HosptEverJ8Ale70Darc>[id number] EverJ8Ale66Darc>[id number] EverJ8Ale59Darc>[id number] GADOBENATE DIMEGLUMINE 529 MG/ML IV SOLN COMPARISON:  02/06/2015 FINDINGS: Lower chest:  No pleural fluid.  No pericardial effusion. Hepatobiliary: No suspicious liver abnormality. Multiple small stones and sludge the gallbladder wall appears mildly thickened measuring 4 mm, image 16 of series 16. No biliary dilatation. Pancreas: Normal appearance of the pancreas. Spleen: The spleen is unremarkable. Adrenals/Urinary Tract: Normal appearance of the adrenal glands. The kidneys both appear within normal limits. No obstructive uropathy. Stomach/Bowel: The stomach is within normal limits. No abnormal dilatation of the upper abdominal bowel loops. Vascular/Lymphatic: The abdominal aorta appears normal. No enlarged upper abdominal lymph nodes. Other: No free fluid or fluid collections. Musculoskeletal: Normal signal from within the bone marrow. IMPRESSION: 1. Gallstones. 2. Mild gallbladder wall thickening. 3. No evidence for biliary dilatation or choledocholithiasis. Electronically Signed   By: Taylor  Stroud M.D.   On: 02/07/2015 14:24  EKG:   Orders placed or performed during the hospital encounter of 02/06/15  . ED EKG  . ED EKG  . EKG 12-Lead  . EKG 12-Lead      Management plans discussed with the patient, family and they are in agreement.  CODE STATUS:     Code Status Orders        Start     Ordered   02/06/15 1601  Full code   Continuous     02/06/15 1600      TOTAL TIME TAKING CARE OF THIS PATIENT: 32 minutes.  Greater than  50% of time spent in care coordination and counseling. Conversation with the assistance of interpreter.  Elby Showers M.D on 02/08/2015 at 1:53 PM  Between 7am to 6pm - Pager - [phone]  After 6pm go to [email or web address] - password [email or web address] [facility]  [place] [facility]  Office  [phone]  CC: Primary care physician; No PCP Per Patient

## 2015-02-08 NOTE — Consult Note (Addendum)
Subjective: Patient seen for abnormal liver tests.  Patient had hida this am, tolerated clears yesterday.  Denies nausea or abdominal pain.   Objective: Vital signs in last 24 hours: Temp:  [98 F (36.7 C)-98.6 F (37 C)] 98.6 F (37 C) (12/21 2014) Pulse Rate:  [73-84] 84 (12/21 2014) Resp:  [18] 18 (12/21 2014) BP: (109-115)/(58-75) 115/75 mmHg (12/21 2014) SpO2:  [100 %] 100 % (12/21 2014) Blood pressure 115/75, pulse 84, temperature 98.6 F (37 C), temperature source Oral, resp. rate 18, height 5' (1.524 m), weight 64.411 kg (142 lb), last menstrual period 12/23/2014, SpO2 100 %.   Intake/Output from previous day: 12/21 0701 - 12/22 0700 In: 2354 [P.O.:750; I.V.:1604] Out: 700 [Urine:700]  Intake/Output this shift: Total I/O In: 1249 [I.V.:1249] Out: 0    General appearance:  Well appearing 29 f no distress. Resp:  bilat cta Cardio:  rrr without rmg GI:   Soft non-tender, non-distended, bowel sounds positive Extremities:  No cce   Lab Results: Results for orders placed or performed during the hospital encounter of 02/06/15 (from the past 24 hour(s))  CBC     Status: None   Collection Time: 02/08/15  5:45 AM  Result Value Ref Range   WBC 7.8 3.6 - 11.0 K/uL   RBC 4.14 3.80 - 5.20 MIL/uL   Hemoglobin 12.1 12.0 - 16.0 g/dL   HCT 40.9 81.1 - 91.4 %   MCV 88.4 80.0 - 100.0 fL   MCH 29.2 26.0 - 34.0 pg   MCHC 33.0 32.0 - 36.0 g/dL   RDW 78.2 95.6 - 21.3 %   Platelets 175 150 - 440 K/uL  Comprehensive metabolic panel     Status: Abnormal   Collection Time: 02/08/15  5:45 AM  Result Value Ref Range   Sodium 131 (L) 135 - 145 mmol/L   Potassium 3.4 (L) 3.5 - 5.1 mmol/L   Chloride 108 101 - 111 mmol/L   CO2 19 (L) 22 - 32 mmol/L   Glucose, Bld 85 65 - 99 mg/dL   BUN 6 6 - 20 mg/dL   Creatinine, Ser 0.86 (L) 0.44 - 1.00 mg/dL   Calcium 7.8 (L) 8.9 - 10.3 mg/dL   Total Protein 6.2 (L) 6.5 - 8.1 g/dL   Albumin 3.3 (L) 3.5 - 5.0 g/dL   AST 578 (H) 15 - 41 U/L   ALT  343 (H) 14 - 54 U/L   Alkaline Phosphatase 258 (H) 38 - 126 U/L   Total Bilirubin 2.8 (H) 0.3 - 1.2 mg/dL   GFR calc non Af Amer >60 >60 mL/min   GFR calc Af Amer >60 >60 mL/min   Anion gap 4 (L) 5 - 15  Bilirubin, direct     Status: Abnormal   Collection Time: 02/08/15  5:45 AM  Result Value Ref Range   Bilirubin, Direct 1.3 (H) 0.1 - 0.5 mg/dL      Recent Labs  46/96/29 0747 02/07/15 0531 02/08/15 0545  WBC 7.9 8.2 7.8  HGB 13.6 12.7 12.1  HCT 40.7 38.3 36.6  PLT 196 168 175   BMET  Recent Labs  02/06/15 0747 02/07/15 0531 02/08/15 0545  NA 136 137 131*  K 3.8 3.5 3.4*  CL 109 112* 108  CO2 22 19* 19*  GLUCOSE 118* 63* 85  BUN CREATININE 0.52 0.35* 0.40*  CALCIUM 9.0 8.1* 7.8*   LFT  Recent Labs  02/08/15 0545  PROT 6.2*  ALBUMIN 3.3*  AST 227*  ALT 343*  ALKPHOS 258*  BILITOT 2.8*  BILIDIR 1.3*   PT/INR  Recent Labs  02/06/15 0747 02/07/15 0531  LABPROT 14.0 14.8  INR 1.06 1.14   Hepatitis Panel  Recent Labs  02/06/15 0747  HEPBSAG Negative  HCVAB <0.1  HEPAIGM Negative  HEPBIGM Negative   C-Diff No results for input(s): CDIFFTOX in the last 72 hours. No results for input(s): CDIFFPCR in the last 72 hours.   Studies/Results: Nm Hepatobiliary Liver Func  02/08/2015  CLINICAL DATA:  Right upper quadrant pain, cholelithiasis, elevated bili Rubin EXAM: NUCLEAR MEDICINE HEPATOBILIARY IMAGING TECHNIQUE: Sequential images of the abdomen were obtained out to 60 minutes following intravenous administration of radiopharmaceutical. RADIOPHARMACEUTICALS:  7.4 mCi Tc-26m  Choletec IV COMPARISON:  02/07/2015, 02/06/2015 FINDINGS: There is normal hepatic uptake of the radiotracer and excretion into the biliary tract. Gallbladder is visualized at approximately 20 minutes and continues to accumulate radiotracer. Therefore the cystic duct and common bile duct are patent. Contrast progresses throughout the small bowel. IMPRESSION: Patent cystic  duct and common bile duct. Electronically Signed   By: Judie Petit.  Shick M.D.   On: 02/08/2015 11:41   Ct Abdomen Pelvis W Contrast  02/06/2015  CLINICAL DATA:  Patient with upper abdominal pain and vomiting. EXAM: CT ABDOMEN AND PELVIS WITH CONTRAST TECHNIQUE: Multidetector CT imaging of the abdomen and pelvis was performed using the standard protocol following bolus administration of intravenous contrast. CONTRAST:  100 cc Omnipaque 300 COMPARISON:  Right upper quadrant ultrasound earlier same day. FINDINGS: Lower chest: Heart is normal in size. No pericardial effusion. Minimal atelectasis right lower lobe. No consolidative pulmonary opacity. No pleural effusion. Hepatobiliary: Liver is normal in size and contour. No focal hepatic lesion is identified. Multiple calcified stones are demonstrated within the gallbladder lumen. No intrahepatic or extrahepatic biliary ductal dilatation. High density material is demonstrated throughout the cystic duct and common bile duct which is nondilated. Pancreas: Unremarkable Spleen: Unremarkable Adrenals/Urinary Tract: The adrenal glands are normal. Kidneys enhance symmetrically with contrast. No hydronephrosis. Urinary bladder is unremarkable. Stomach/Bowel: No abnormal bowel wall thickening or evidence for bowel obstruction. The appendix is normal. No free fluid or free intraperitoneal air. Normal morphology of the stomach. Vascular/Lymphatic: Normal caliber abdominal aorta. No retroperitoneal lymphadenopathy. Other: Uterus is unremarkable. Follicle within the left ovary. Normal appearing right ovary. Musculoskeletal: No aggressive or acute appearing osseous lesions. IMPRESSION: There is increased density throughout the cystic duct and proximal common bile duct compatible with choledocholithiasis. The common bile duct is nondilated. There are multiple additional stones and sludge within the gallbladder lumen. Electronically Signed   By: Annia Belt M.D.   On: 02/06/2015 13:31    Mr Roe Coombs W/wo Cm/mrcp  02/07/2015  CLINICAL DATA:  Epigastric pain. EXAM: MRI ABDOMEN WITHOUT AND WITH CONTRAST (INCLUDING MRCP) TECHNIQUE: Multiplanar multisequence MR imaging of the abdomen was performed both before and after the administration of intravenous contrast. Heavily T2-weighted images of the biliary and pancreatic ducts were obtained, and three-dimensional MRCP images were rendered by post processing. CONTRAST:  13mL MULTIHANCE GADOBENATE DIMEGLUMINE 529 MG/ML IV SOLN COMPARISON:  02/06/2015 FINDINGS: Lower chest:  No pleural fluid.  No pericardial effusion. Hepatobiliary: No suspicious liver abnormality. Multiple small stones and sludge the gallbladder wall appears mildly thickened measuring 4 mm, image 16 of series 16. No biliary dilatation. Pancreas: Normal appearance of the pancreas. Spleen: The spleen is unremarkable. Adrenals/Urinary Tract: Normal appearance of the adrenal glands. The kidneys both appear within normal limits. No obstructive uropathy. Stomach/Bowel: The stomach  is within normal limits. No abnormal dilatation of the upper abdominal bowel loops. Vascular/Lymphatic: The abdominal aorta appears normal. No enlarged upper abdominal lymph nodes. Other: No free fluid or fluid collections. Musculoskeletal: Normal signal from within the bone marrow. IMPRESSION: 1. Gallstones. 2. Mild gallbladder wall thickening. 3. No evidence for biliary dilatation or choledocholithiasis. Electronically Signed   By: Signa Kellaylor  Stroud M.D.   On: 02/07/2015 14:24    Scheduled Inpatient Medications:   . enoxaparin (LOVENOX) injection  40 mg Subcutaneous Q24H    Continuous Inpatient Infusions:   . sodium chloride 125 mL/hr at 02/08/15 0349    PRN Inpatient Medications:  acetaminophen **OR** acetaminophen, alum & mag hydroxide-simeth, HYDROcodone-acetaminophen, ondansetron **OR** ondansetron (ZOFRAN) IV, senna-docusate  Miscellaneous:   Assessment:  1) abnormal lfts, acute hepatitis  serologies negative, labs improving, no obstruction on hida, though evidence of cholelithiasis and possible passage of gallstone.  Currently asymptomatic.  Plan:  1) ok to d/c on lowfat diet, would avoid acetaminophen use, will need fu in GI o/p in a week or so to recheck labs.  I cautioned that if she should have repeat sx to return to the er as she has no primary MD.  Discussed with Dr Clent RidgesWalsh. Patient should maintain good hydration.   Christena DeemMartin U Skulskie MD 02/08/2015, 12:06 PM  I was notified by nursing that patient had recurrent abdominal pain after trying full liquid diet and that she was to be kept overnight again, not d/c today.  Stat lfts ordered. Discussed with Dr Clent RidgesWalsh.    Dr Bluford Kaufmannh to cover for GI starting tomorrow.

## 2015-02-09 LAB — COMPREHENSIVE METABOLIC PANEL
ALBUMIN: 3.5 g/dL (ref 3.5–5.0)
ALK PHOS: 266 U/L — AB (ref 38–126)
ALT: 323 U/L — ABNORMAL HIGH (ref 14–54)
ANION GAP: 4 — AB (ref 5–15)
AST: 237 U/L — AB (ref 15–41)
BILIRUBIN TOTAL: 4.9 mg/dL — AB (ref 0.3–1.2)
BUN: <5 mg/dL — ABNORMAL LOW (ref 6–20)
CALCIUM: 8.6 mg/dL — AB (ref 8.9–10.3)
CO2: 22 mmol/L (ref 22–32)
Chloride: 110 mmol/L (ref 101–111)
Creatinine, Ser: 0.37 mg/dL — ABNORMAL LOW (ref 0.44–1.00)
Glucose, Bld: 91 mg/dL (ref 65–99)
Potassium: 3.7 mmol/L (ref 3.5–5.1)
Sodium: 136 mmol/L (ref 135–145)
TOTAL PROTEIN: 6.8 g/dL (ref 6.5–8.1)

## 2015-02-09 LAB — CBC
HCT: 37.2 % (ref 35.0–47.0)
HEMOGLOBIN: 12.7 g/dL (ref 12.0–16.0)
MCH: 29.7 pg (ref 26.0–34.0)
MCHC: 34 g/dL (ref 32.0–36.0)
MCV: 87.2 fL (ref 80.0–100.0)
PLATELETS: 156 10*3/uL (ref 150–440)
RBC: 4.26 MIL/uL (ref 3.80–5.20)
RDW: 12.7 % (ref 11.5–14.5)
WBC: 6.3 10*3/uL (ref 3.6–11.0)

## 2015-02-09 LAB — LIPASE, BLOOD: LIPASE: 26 U/L (ref 11–51)

## 2015-02-09 MED ORDER — INFLUENZA VAC SPLIT QUAD 0.5 ML IM SUSY
0.5000 mL | PREFILLED_SYRINGE | INTRAMUSCULAR | Status: AC
  Administered 2015-02-09: 0.5 mL via INTRAMUSCULAR
  Filled 2015-02-09: qty 0.5

## 2015-02-09 NOTE — Discharge Instructions (Signed)
°  DIET:  Low fat diet.  Simple foods such as yogurt, rice and fruits.  DISCHARGE CONDITION:  Stable  ACTIVITY:  Activity as tolerated  OXYGEN:  Home Oxygen: No.   Oxygen Delivery: room air  DISCHARGE LOCATION:  home   If you experience worsening of your admission symptoms, develop shortness of breath, life threatening emergency, suicidal or homicidal thoughts you must seek medical attention immediately by calling 911 or calling your MD immediately  if symptoms less severe.  You Must read complete instructions/literature along with all the possible adverse reactions/side effects for all the Medicines you take and that have been prescribed to you. Take any new Medicines after you have completely understood and accpet all the possible adverse reactions/side effects.   Please note  You were cared for by a hospitalist during your hospital stay. If you have any questions about your discharge medications or the care you received while you were in the hospital after you are discharged, you can call the unit and asked to speak with the hospitalist on call if the hospitalist that took care of you is not available. Once you are discharged, your primary care physician will handle any further medical issues. Please note that NO REFILLS for any discharge medications will be authorized once you are discharged, as it is imperative that you return to your primary care physician (or establish a relationship with a primary care physician if you do not have one) for your aftercare needs so that they can reassess your need for medications and monitor your lab values.

## 2015-02-09 NOTE — Progress Notes (Signed)
Pt alert and oriented. Discharged to home. Concerns addressed. IV site removed. Interpreter used.

## 2017-09-05 IMAGING — NM NM HEPATOBILIARY IMAGE, INC GB
1 series · 1 of 1 positions shown · non-contrast
Comparison: 02/07/2015, 02/06/2015

CLINICAL DATA: Right upper quadrant pain, cholelithiasis, elevated
bili Rubin

EXAM:
NUCLEAR MEDICINE HEPATOBILIARY IMAGING
TECHNIQUE: Sequential images of the abdomen were obtained [DATE] minutes
following intravenous administration of radiopharmaceutical.
RADIOPHARMACEUTICALS:  7.4 mCi Jc-QQm  Choletec IV

[Series 1000: gallbladder statics · 4.80mm/px · 1 of 1 slices shown]
[im 1/1]
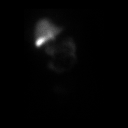

[1 of 1 positions shown; findings below may reference images not displayed]

FINDINGS: There is normal hepatic uptake of the radiotracer and excretion into
the biliary tract. Gallbladder is visualized at approximately 20
minutes and continues to accumulate radiotracer. Therefore the
cystic duct and common bile duct are patent. Contrast progresses
throughout the small bowel.
IMPRESSION: Patent cystic duct and common bile duct.
